# Patient Record
Sex: Male | Born: 1960 | Race: White | Hispanic: No | Marital: Married | Smoking: Current every day smoker
Health system: Southern US, Community
[De-identification: ages and names within clinical notes are randomized; demographics above are authoritative.]

## PROBLEM LIST (undated history)

## (undated) DIAGNOSIS — R569 Unspecified convulsions: Secondary | ICD-10-CM

## (undated) DIAGNOSIS — I10 Essential (primary) hypertension: Secondary | ICD-10-CM

## (undated) DIAGNOSIS — D496 Neoplasm of unspecified behavior of brain: Secondary | ICD-10-CM

## (undated) HISTORY — PX: BRAIN BIOPSY: SHX905

---

## 2013-07-29 ENCOUNTER — Emergency Department (HOSPITAL_COMMUNITY)
Admission: EM | Admit: 2013-07-29 | Discharge: 2013-07-29 | Disposition: A | Discharge status: Other Acute Inpatient Hospital | Payer: Medicare Other | Attending: Emergency Medicine | Admitting: Emergency Medicine

## 2013-07-29 ENCOUNTER — Emergency Department (HOSPITAL_COMMUNITY): Payer: Medicare Other

## 2013-07-29 ENCOUNTER — Encounter (HOSPITAL_COMMUNITY): Payer: Self-pay | Admitting: Emergency Medicine

## 2013-07-29 DIAGNOSIS — I10 Essential (primary) hypertension: Secondary | ICD-10-CM | POA: Insufficient documentation

## 2013-07-29 DIAGNOSIS — Z9221 Personal history of antineoplastic chemotherapy: Secondary | ICD-10-CM | POA: Insufficient documentation

## 2013-07-29 DIAGNOSIS — F17200 Nicotine dependence, unspecified, uncomplicated: Secondary | ICD-10-CM | POA: Insufficient documentation

## 2013-07-29 DIAGNOSIS — I619 Nontraumatic intracerebral hemorrhage, unspecified: Secondary | ICD-10-CM

## 2013-07-29 DIAGNOSIS — I610 Nontraumatic intracerebral hemorrhage in hemisphere, subcortical: Secondary | ICD-10-CM

## 2013-07-29 DIAGNOSIS — Z8669 Personal history of other diseases of the nervous system and sense organs: Secondary | ICD-10-CM | POA: Insufficient documentation

## 2013-07-29 DIAGNOSIS — Z85841 Personal history of malignant neoplasm of brain: Secondary | ICD-10-CM | POA: Insufficient documentation

## 2013-07-29 HISTORY — DX: Unspecified convulsions: R56.9

## 2013-07-29 HISTORY — DX: Neoplasm of unspecified behavior of brain: D49.6

## 2013-07-29 HISTORY — DX: Essential (primary) hypertension: I10

## 2013-07-29 LAB — PROTIME-INR
INR: 1.01 (ref 0.00–1.49)
Prothrombin Time: 13.1 seconds (ref 11.6–15.2)

## 2013-07-29 LAB — DIFFERENTIAL
Basophils Absolute: 0 10*3/uL (ref 0.0–0.1)
Basophils Relative: 0 % (ref 0–1)
EOS PCT: 0 % (ref 0–5)
Eosinophils Absolute: 0 10*3/uL (ref 0.0–0.7)
LYMPHS ABS: 1.7 10*3/uL (ref 0.7–4.0)
Lymphocytes Relative: 12 % (ref 12–46)
Monocytes Absolute: 0.6 10*3/uL (ref 0.1–1.0)
Monocytes Relative: 4 % (ref 3–12)
NEUTROS ABS: 12.7 10*3/uL — AB (ref 1.7–7.7)
Neutrophils Relative %: 84 % — ABNORMAL HIGH (ref 43–77)

## 2013-07-29 LAB — COMPREHENSIVE METABOLIC PANEL
ALK PHOS: 110 U/L (ref 39–117)
ALT: 27 U/L (ref 0–53)
AST: 20 U/L (ref 0–37)
Albumin: 3.3 g/dL — ABNORMAL LOW (ref 3.5–5.2)
BUN: 28 mg/dL — ABNORMAL HIGH (ref 6–23)
CHLORIDE: 107 meq/L (ref 96–112)
CO2: 21 meq/L (ref 19–32)
Calcium: 9.1 mg/dL (ref 8.4–10.5)
Creatinine, Ser: 1.12 mg/dL (ref 0.50–1.35)
GFR, EST AFRICAN AMERICAN: 84 mL/min — AB (ref >90)
GFR, EST NON AFRICAN AMERICAN: 73 mL/min — AB (ref >90)
GLUCOSE: 136 mg/dL — AB (ref 70–99)
Potassium: 4.3 mEq/L (ref 3.7–5.3)
SODIUM: 140 meq/L (ref 137–147)
Total Protein: 6.7 g/dL (ref 6.0–8.3)

## 2013-07-29 LAB — URINALYSIS, ROUTINE W REFLEX MICROSCOPIC
Bilirubin Urine: NEGATIVE
GLUCOSE, UA: NEGATIVE mg/dL
Hgb urine dipstick: NEGATIVE
KETONES UR: NEGATIVE mg/dL
LEUKOCYTES UA: NEGATIVE
NITRITE: NEGATIVE
PH: 6 (ref 5.0–8.0)
Protein, ur: NEGATIVE mg/dL
Specific Gravity, Urine: 1.023 (ref 1.005–1.030)
Urobilinogen, UA: 0.2 mg/dL (ref 0.0–1.0)

## 2013-07-29 LAB — I-STAT CHEM 8, ED
BUN: 26 mg/dL — AB (ref 6–23)
CREATININE: 1.4 mg/dL — AB (ref 0.50–1.35)
Calcium, Ion: 1.19 mmol/L (ref 1.12–1.23)
Chloride: 109 mEq/L (ref 96–112)
GLUCOSE: 130 mg/dL — AB (ref 70–99)
HCT: 43 % (ref 39.0–52.0)
HEMOGLOBIN: 14.6 g/dL (ref 13.0–17.0)
POTASSIUM: 4 meq/L (ref 3.7–5.3)
Sodium: 141 mEq/L (ref 137–147)
TCO2: 22 mmol/L (ref 0–100)

## 2013-07-29 LAB — RAPID URINE DRUG SCREEN, HOSP PERFORMED
Amphetamines: NOT DETECTED
BARBITURATES: NOT DETECTED
Benzodiazepines: NOT DETECTED
Cocaine: NOT DETECTED
Opiates: POSITIVE — AB
Tetrahydrocannabinol: NOT DETECTED

## 2013-07-29 LAB — CBC
HCT: 42.1 % (ref 39.0–52.0)
Hemoglobin: 14.4 g/dL (ref 13.0–17.0)
MCH: 32.4 pg (ref 26.0–34.0)
MCHC: 34.2 g/dL (ref 30.0–36.0)
MCV: 94.6 fL (ref 78.0–100.0)
PLATELETS: 184 10*3/uL (ref 150–400)
RBC: 4.45 MIL/uL (ref 4.22–5.81)
RDW: 13.3 % (ref 11.5–15.5)
WBC: 15.1 10*3/uL — AB (ref 4.0–10.5)

## 2013-07-29 LAB — ETHANOL: Alcohol, Ethyl (B): <11 mg/dL (ref 0–11)

## 2013-07-29 LAB — I-STAT TROPONIN, ED: Troponin i, poc: 0.01 ng/mL (ref 0.00–0.08)

## 2013-07-29 LAB — APTT: aPTT: 25 seconds (ref 24–37)

## 2013-07-29 MED ORDER — LABETALOL HCL 5 MG/ML IV SOLN: 10.0000 mg | INTRAVENOUS | Status: DC | PRN

## 2013-07-29 MED ORDER — NALOXONE HCL 0.4 MG/ML IJ SOLN
0.4000 mg | Freq: Once | INTRAMUSCULAR | Status: AC
  Administered 2013-07-29: 0.4 mg via INTRAVENOUS
  Filled 2013-07-29: qty 1

## 2013-07-29 NOTE — ED Notes (Addendum)
PER EMS: pt from home,LSN was today at 1800 and wife noticed pt was having slurred speech, left sided facial drooping at 2000. 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 Pt's left arm is flaccid and left lower extremity weakness. Pt A&OX4.

## 2013-07-29 NOTE — ED Notes (Signed)
Pt still very drowsy, sleeping and snoring, but quick to respond to verbal stimuli.

## 2013-07-29 NOTE — ED Provider Notes (Signed)
CSN: [id number]     Arrival date & time 07/29/13  0031 History   First MD Initiated Contact with Patient 07/29/13 0035     Chief Complaint  Patient presents with  . Code Stroke    An emergency department physician performed an initial assessment on this suspected stroke patient at 53. (Consider location/radiation/quality/duration/timing/severity/associated sxs/prior Treatment) HPI 53 year old male presents to emergency department as a code stroke.  Patient was last seen normal at 6 PM.  A sister, who lives with him, reports that she took a nap and woke up at 8 PM.  At that time she noted that he had a left facial droop, left arm weakness left leg weakness.  Patient has history of brain tumor, followed at [facility].  Per report this is a known brain tumor for last 13 years. Patient reports he has been on chemotherapy, is due to start radiation therapy this upcoming week.  Patient denies any current problems.  He denies weakness numbness.  He denies any difficulty speaking.  Patient seems unaware that he has left-sided deficits.  He denies any current headache.  Past history of hypertension and seizures also given by EMS. Past Medical History  Diagnosis Date  . Brain tumor   . Hypertension   . Seizures    Past Surgical History  Procedure Laterality Date  . Brain biopsy     No family history on file. History  Substance Use Topics  . Smoking status: Current Every Day Smoker -- 1.00 packs/day    Types: Cigarettes  . Smokeless tobacco: Not on file  . Alcohol Use: Not on file    Review of Systems  Unable to perform ROS: Acuity of condition   patient has difficulty answering questions regarding review of systems somnolent but arousable    Allergies  Benadryl  Home Medications   Prior to Admission medications   Not on File   BP 157/76  Pulse 54  Resp 12  SpO2 100% Physical Exam  Nursing note and vitals reviewed. Constitutional: He is oriented to person, place, and time. He  appears well-developed and well-nourished.  Appears older than stated age  HENT:  Head: Normocephalic and atraumatic.  Right Ear: External ear normal.  Left Ear: External ear normal.  Nose: Nose normal.  Mouth/Throat: Oropharynx is clear and moist.  Eyes: Conjunctivae and EOM are normal. Pupils are equal, round, and reactive to light.  Neck: Normal range of motion. Neck supple. No JVD present. No tracheal deviation present. No thyromegaly present.  Cardiovascular: Normal rate, regular rhythm, normal heart sounds and intact distal pulses.  Exam reveals no gallop and no friction rub.   No murmur heard. Pulmonary/Chest: Effort normal and breath sounds normal. No stridor. No respiratory distress. He has no wheezes. He has no rales. He exhibits no tenderness.  Abdominal: Soft. Bowel sounds are normal. He exhibits no distension and no mass. There is no tenderness. There is no rebound and no guarding.  Musculoskeletal: Normal range of motion. He exhibits no edema and no tenderness.  Patient has left upper arm and leg weakness, 1/5 for arm, 2/5 for leg  Lymphadenopathy:    He has no cervical adenopathy.  Neurological: He is oriented to person, place, and time. He has normal reflexes. No cranial nerve deficit. He exhibits normal muscle tone. Coordination normal.  Somnolent but arousable.  He is oriented x3.  He has slurred speech.  He has left-sided facial droop.  He has left arm and leg weakness.  Skin: Skin is  warm and dry. No rash noted. No erythema. No pallor.  Psychiatric: He has a normal mood and affect. His behavior is normal. Judgment and thought content normal.    ED Course  Procedures (including critical care time)  CRITICAL CARE Performed by: Kalman Drape Total critical care time: 60 min Critical care time was exclusive of separately billable procedures and treating other patients. Critical care was necessary to treat or prevent imminent or life-threatening deterioration. Critical  care was time spent personally by me on the following activities: development of treatment plan with patient and/or surrogate as well as nursing, discussions with consultants, evaluation of patient's response to treatment, examination of patient, obtaining history from patient or surrogate, ordering and performing treatments and interventions, ordering and review of laboratory studies, ordering and review of radiographic studies, pulse oximetry and re-evaluation of patient's condition.  Labs Review Labs Reviewed  CBC - Abnormal; Notable for the following:    WBC 15.1 (*)    All other components within normal limits  DIFFERENTIAL - Abnormal; Notable for the following:    Neutrophils Relative % 84 (*)    Neutro Abs 12.7 (*)    All other components within normal limits  COMPREHENSIVE METABOLIC PANEL - Abnormal; Notable for the following:    Glucose, Bld 136 (*)    BUN 28 (*)    Albumin 3.3 (*)    Total Bilirubin <0.2 (*)    GFR calc non Af Amer 73 (*)    GFR calc Af Amer 84 (*)    All other components within normal limits  URINE RAPID DRUG SCREEN (HOSP PERFORMED) - Abnormal; Notable for the following:    Opiates POSITIVE (*)    All other components within normal limits  I-STAT CHEM 8, ED - Abnormal; Notable for the following:    BUN 26 (*)    Creatinine, Ser 1.40 (*)    Glucose, Bld 130 (*)    All other components within normal limits  PROTIME-INR  APTT  URINALYSIS, ROUTINE W REFLEX MICROSCOPIC  ETHANOL  I-STAT TROPOININ, ED  I-STAT TROPOININ, ED    Imaging Review Ct Head Wo Contrast  07/29/2013   CLINICAL DATA:  53 year old male code stroke. Last seen normal 1800 hrs. Slurred speech left facial droop. Remote history of brain tumor, reportedly low grade primary. Initial encounter.  EXAM: CT HEAD WITHOUT CONTRAST  TECHNIQUE: Contiguous axial images were obtained from the base of the skull through the vertex without intravenous contrast.  COMPARISON:  None.  FINDINGS: Low-density  fluid levels in the maxillary sinuses, the left subtotally opacified. Other Visualized paranasal sinuses and mastoids are clear. Right vertex burr hole placement. No acute osseous abnormality identified.  Oval hyperdense intra-axial hemorrhage centered at the right lentiform nuclei measuring 38 x 17 mm (estimated volume 11 mL). Surrounding hypodensity, with superimposed more confluent superior right frontal lobe white matter hypodensity which might be chronic. Mild regional mass effect, including on the right lateral ventricle. Midline shift of 2-3 mm. Hypodensity tracking in the right temporal lobe which more resembles edema then encephalomalacia, right temporal horn effaced.  Basilar cisterns are patent. No ventriculomegaly. No other acute intracranial hemorrhage identified.  However, there is some cortical hypodensity in the right temporal lobe and insula. The right MCA M1 segment is mildly hyperdense (series 21, image 13).  No other suspicious intracranial vascular hyperdensity.  However, there is dystrophic appearing calcification and mildly indistinct hyperdensity anterior and superior to the acute hemorrhage.  IMPRESSION: 1. Right lentiform nucleus intra-axial  hemorrhage. 2. Right MCA territory edema out of proportion to the hemorrhage. Mild mass effect on the right lateral ventricle with up to 3 mm leftward midline shift. No ventriculomegaly. Appearance could reflect superimposed right MCA ischemic infarct, such as from right M1 occlusion. Study discussed by telephone with Dr. Roland Rack on 07/29/2013 at 0100 hrs. We discussed the possibility that the right MCA edema a is related to reported history of chronic infiltrative right brain tumor rather than a larger area of right MCA infarct. The acute intra-axial hemorrhage does have a typical configuration for hypertensive/small vessel disease.   Electronically Signed   By: Lars Pinks M.D.   On: 07/29/2013 01:05     EKG Interpretation   Date/Time:   Saturday Jul 29 2013 00:52:18 EDT Ventricular Rate:  62 PR Interval:  138 QRS Duration: 85 QT Interval:  425 QTC Calculation: 432 R Axis:   51 Text Interpretation:  Sinus rhythm RSR' in V1 or V2, right VCD or RVH  Nonspecific T abnormalities, anterior leads ST elevation, consider lateral  injury No old tracing to compare Confirmed by Brissia Delisa  MD, Keaundra Stehle ([id number]) on  07/29/2013 1:16:41 AM      MDM   Final diagnoses:  Basal ganglia hemorrhage  ICH (intracerebral hemorrhage)   53 year old male initial code stroke which has been canceled due to hemorrhage seen on CT scan.  Patient has been seen by Dr. Leonel Ramsay with neurology.  As patient has had all of his care at [facility] per his report, we will try to obtain records from their health system.  Patient is requesting to go to [facility], and Dr. Leonel Ramsay agrees that he may be best served for continuity of care at his own facility.  Will discuss with the on-call neurologist at [facility] for transfer.  Patient has labetalol standing order for hypertension.  Patient is somnolent, and there is concern about decreasing mental status.  However, after a small dose of Narcan patient is more alert.  He is follow commands, he is protecting his airway.  At this time he is not requiring intubation. 3:13 AM Case discussed with Dr. Ginny Forth with [facility] neurology who accepts in transfer.  We will printout copy of his images on CD and paper copies of his current chart.    Kalman Drape, MD 07/29/13 [phone]

## 2013-07-29 NOTE — ED Notes (Signed)
Pt appearing to be more drowsy with eyes closed, sleeping and snoring but pt quick to respond to speech and stimulation.

## 2013-07-29 NOTE — ED Notes (Signed)
This RN called CT to get images on a disc.

## 2013-07-29 NOTE — Consult Note (Addendum)
Neurology Consultation Reason for Consult: Hemorrhage Referring Physician: Margaretha Seeds  CC: Hemorrhage  History is obtained from:Seth Perry, EMS  HPI: Seth Perry is a 53 y.o. male who presented as a code stroke with a history of brain tumor treated at [facility] who was recently undergoing chemotherapy with a plan to soon start radiation. He was last seen normal at 6pm and then his sister took a nap. On arousal around 8pm, she noticed he was not moving his left side. He has been in a similar state since first noticing of symptoms at 8pm(per EMS). He currently is mildly somnolent, butt readily awakens and interacts.    LKW: 6pm tpa given?: no, hemorrhage, out of window.    ROS: A 14 point ROS was performed and is negative except as noted in the HPI.  Past Medical History  Diagnosis Date  . Brain tumor   . Hypertension   . Seizures     Family History: No hx similar  Social History: Tob: staying with sister.   Exam: Current vital signs: BP 138/72  Pulse 62  Resp 13  SpO2 95% Vital signs in last 24 hours: Pulse Rate:  [62-64] 62 (05/30 0100) Resp:  [13-18] 13 (05/30 0100) BP: (138-153)/(72-82) 138/72 mmHg (05/30 0100) SpO2:  [95 %-97 %] 95 % (05/30 0100)  General: in bed, NAD CV: RRR Mental Status: Seth Perry is awake, alert, oriented to person, place, month, age. Seth Perry is able to give a clear and coherent history. No signs of aphasia or neglect Cranial Nerves: II: Visual Fields are full. Pupils are equal, round, and reactive to light.  Discs are difficult to visualize. III,IV, VI: EOMI without ptosis or diploplia.  V: Facial sensation is symmetric to temperature VII: Facial movement is notable for a left facial droop.  VIII: hearing is intact to voice X: Uvula elevates symmetrically XI: Shoulder shrug is decreased on left XII: tongue deviates to the left.  Motor: Tone is normal. Bulk is normal. 5/5 strength was present on the right, on the left, he has  2 - 3/5 weakness  of the left arm and 4-/5 weakness of the left leg.  Sensory: Sensation is decreased to LT on left, but able to correctly identify touches.  Deep Tendon Reflexes: 2+ and symmetric in the biceps and patellae.  Cerebellar: FNF and HKS are not able to be performed on left due to weakness.  Gait: Not assessed due to acute nature of evaluation and multiple medical monitors in ED setting.     I have reviewed labs in epic and the results pertinent to this consultation are: INR 1.01 PTT 25 plt 184  I have reviewed the images obtained: CT head - deep hemorrhage in teh rightl ganglia, also edema of that region, likley related to tumor.   Impression: 53 yo M with new right BG hemorrhage. His mental status is still such that he easily awakens an I think that he is protecting his airway, though this will need to be monitored closely and if he has any further decline, then intubation might need to be considered. Seth Perry was asking to be taken to [facility], I think taht this would be very reasonable given that he is followed there for his brain tumor and decisions regarding aggressivenes of care that might need to be made over the next few days could be made by physicians more familiar with him.   Recommendations: 1) labetalol to keep BP < 017 systolic.  2) discussion with outside hospital regarding Seth Perry's diagnosis.  3) No antiplatelets or antithrombotics.  4) Will need ICU, if not at [facility] then here     Roland Rack, MD [facility] [phone]  If 7pm- 7am, please page neurology on call as listed in [place].

## 2015-03-24 IMAGING — CT CT HEAD W/O CM
2 series · 14 of 30 positions shown, 18 images · non-contrast
Comparison: None.

CLINICAL DATA: 52-year-old male code stroke. Last seen normal 0855
hrs. Slurred speech left facial droop. Remote history of brain
tumor, reportedly low grade primary. Initial encounter.

EXAM:
CT HEAD WITHOUT CONTRAST
TECHNIQUE: Contiguous axial images were obtained from the base of the skull
through the vertex without intravenous contrast.

[Series 201: head w/o, idose (1) · axial · non-contrast · 0.43mm/px · z∈[+944,+1084]mm · 13 of 34 slices shown, 17 images]
[im 3/34  brain]
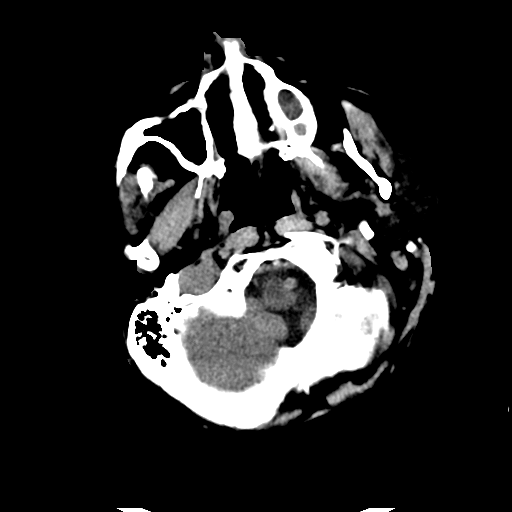
[im 3/34  bone]
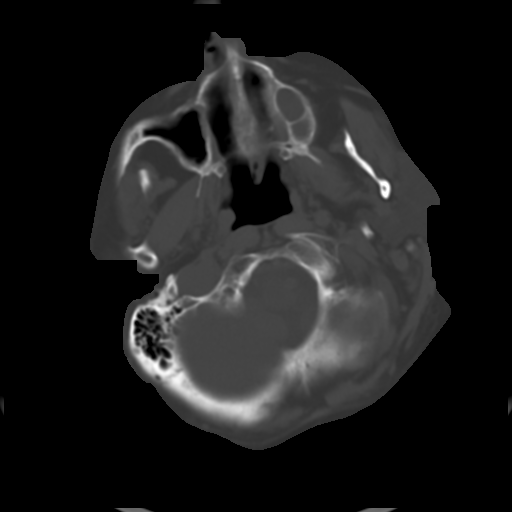
[im 5/34  brain]
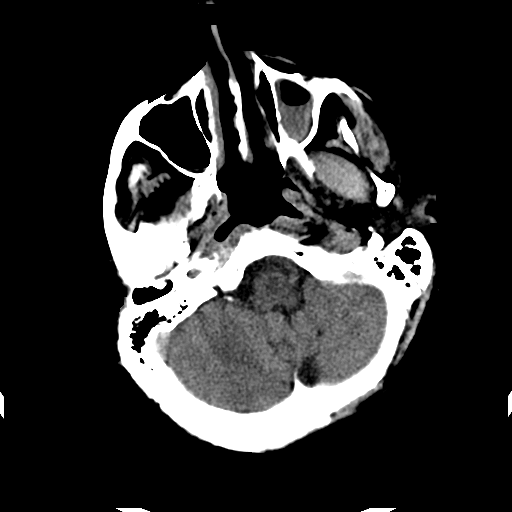
[im 8/34  brain]
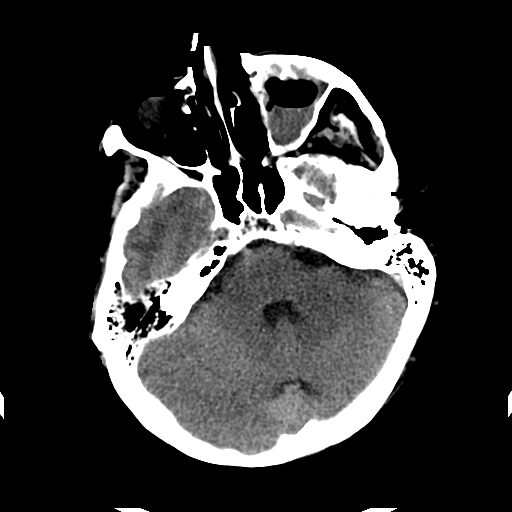
[im 10/34  brain]
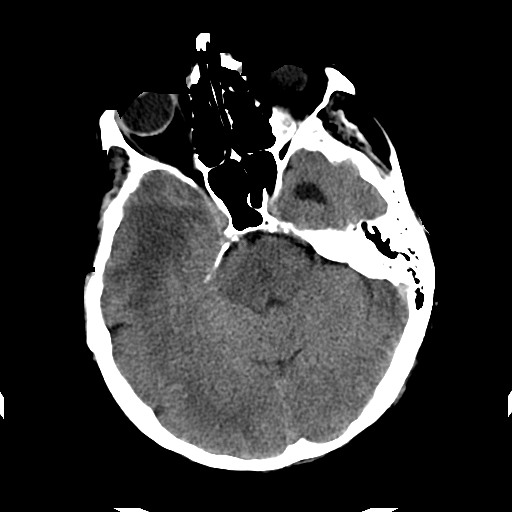
[im 12/34  brain]
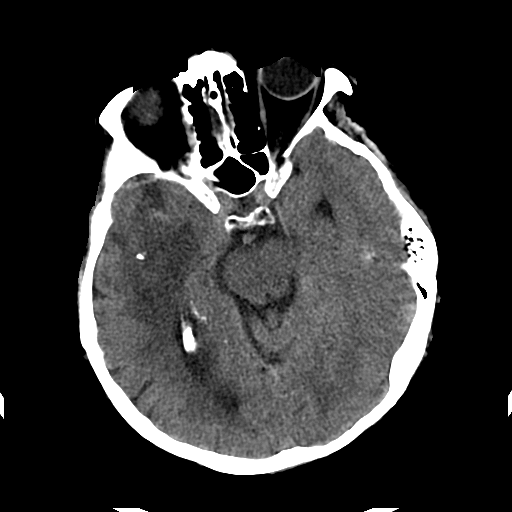
[im 12/34  bone]
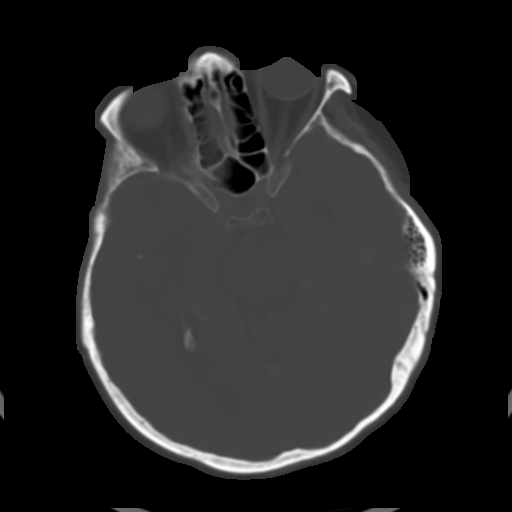
[im 15/34  brain]
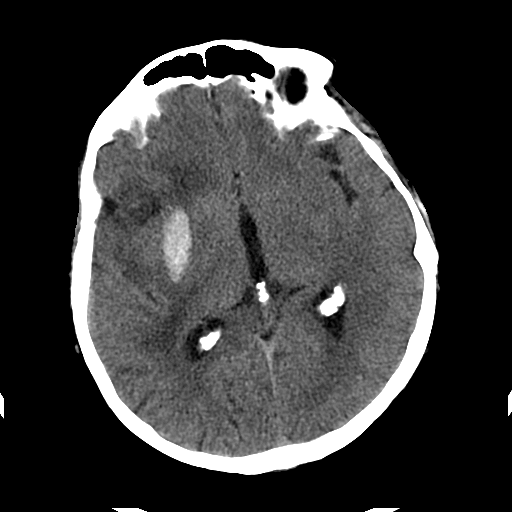
[im 17/34  brain]
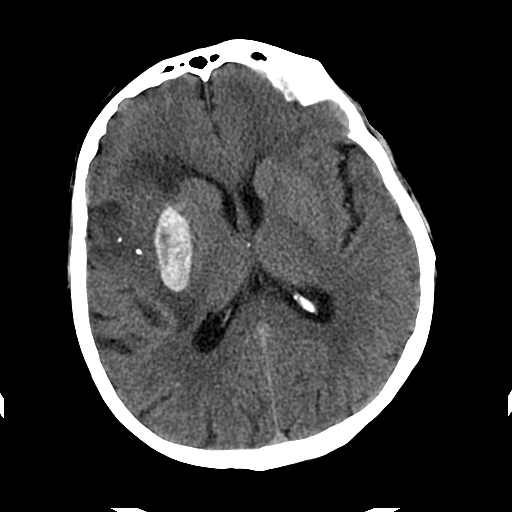
[im 19/34  brain]
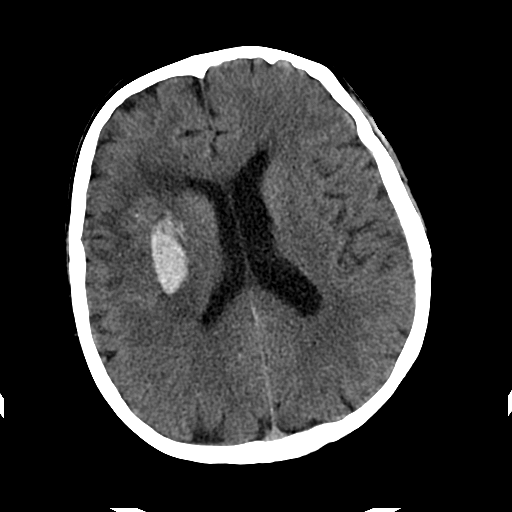
[im 22/34  brain]
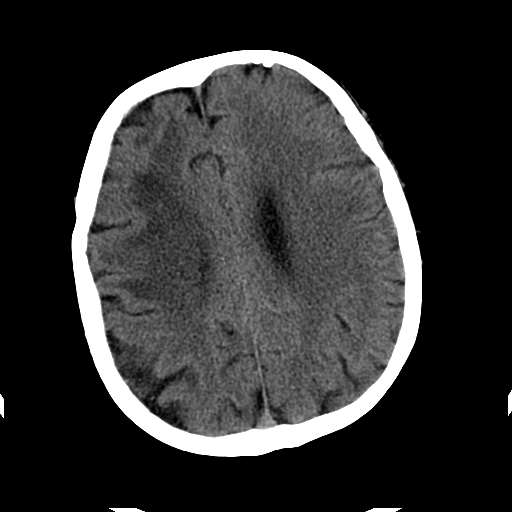
[im 22/34  bone]
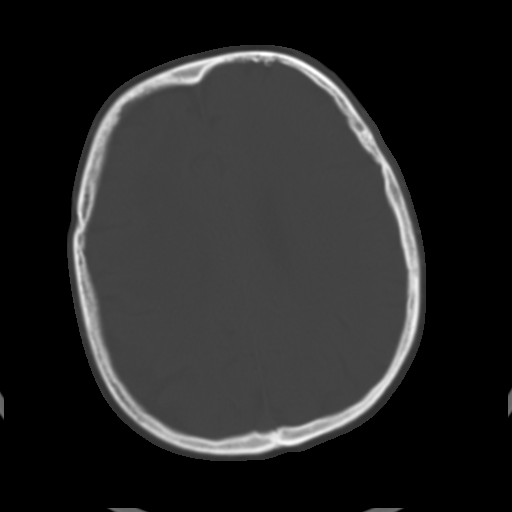
[im 24/34  brain]
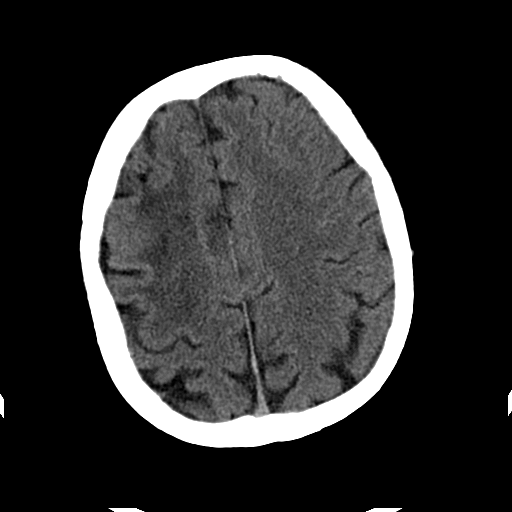
[im 26/34  brain]
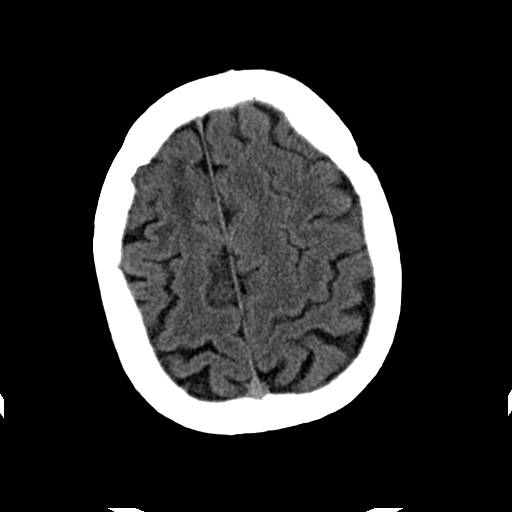
[im 29/34  brain]
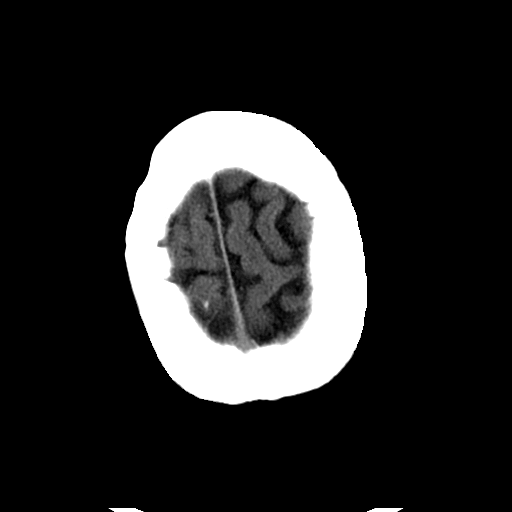
[im 31/34  brain]
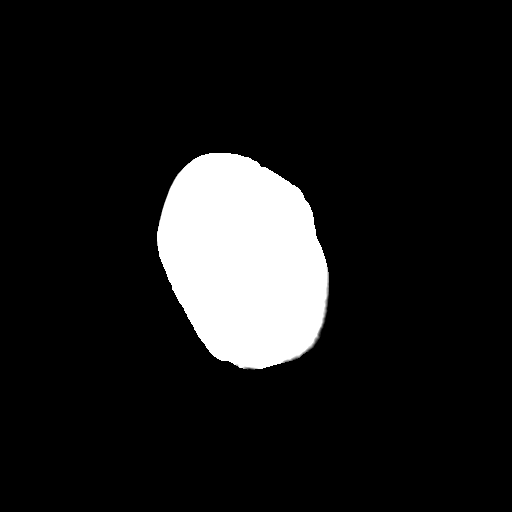
[im 31/34  bone]
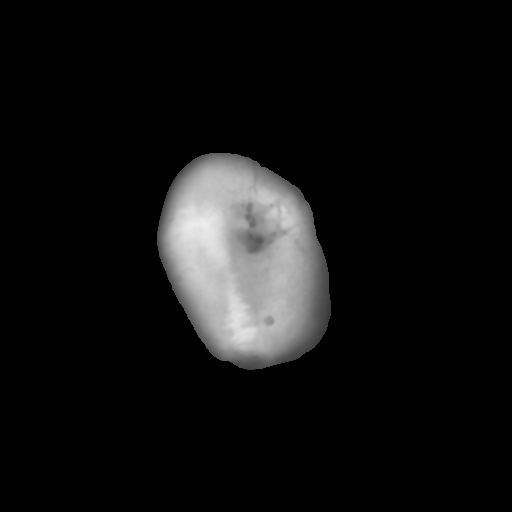

[Series 202: head w/o bone, idose (1) · axial · non-contrast · 0.43mm/px · 1 of 34 slices shown]
[im 3/34  bone]
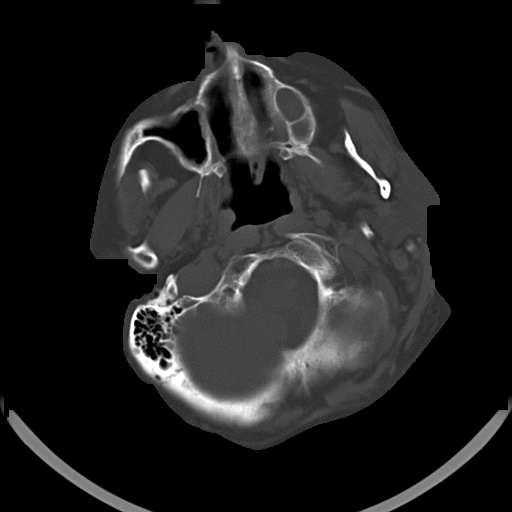

[14 of 30 positions shown; findings below may reference images not displayed]

FINDINGS: Low-density fluid levels in the maxillary sinuses, the left
subtotally opacified. Other Visualized paranasal sinuses and
mastoids are clear. Right vertex burr hole placement. No acute
osseous abnormality identified.

Oval hyperdense intra-axial hemorrhage centered at the right
lentiform nuclei measuring 38 x 17 mm (estimated volume 11 mL).
Surrounding hypodensity, with superimposed more confluent superior
right frontal lobe white matter hypodensity which might be chronic.
Mild regional mass effect, including on the right lateral ventricle.
Midline shift of 2-3 mm. Hypodensity tracking in the right temporal
lobe which more resembles edema then encephalomalacia, right
temporal horn effaced.

Basilar cisterns are patent. No ventriculomegaly. No other acute
intracranial hemorrhage identified.

However, there is some cortical hypodensity in the right temporal
lobe and insula. The right MCA M1 segment is mildly hyperdense
(series 21, image 13).

No other suspicious intracranial vascular hyperdensity.

However, there is dystrophic appearing calcification and mildly
indistinct hyperdensity anterior and superior to the acute
hemorrhage.
IMPRESSION: 1. Right lentiform nucleus intra-axial hemorrhage.
2. Right MCA territory edema out of proportion to the hemorrhage.
Mild mass effect on the right lateral ventricle with up to 3 mm
leftward midline shift. No ventriculomegaly. Appearance could
reflect superimposed right MCA ischemic infarct, such as from right
M1 occlusion.
Study discussed by telephone with Dr. Vilbar Bong Sun on
07/29/2013 at 3333 hrs. We discussed the possibility that the right
MCA edema a is related to reported history of chronic infiltrative
right brain tumor rather than a larger area of right MCA infarct.
The acute intra-axial hemorrhage does have a typical configuration
for hypertensive/small vessel disease.

## 2015-08-01 DEATH — deceased
